# Patient Record
Sex: Male | Born: 2005 | Race: Black or African American | Hispanic: No | Marital: Single | Smoking: Never smoker
Health system: Southern US, Community
[De-identification: ages and names within clinical notes are randomized; demographics above are authoritative.]

## PROBLEM LIST (undated history)

## (undated) DIAGNOSIS — L600 Ingrowing nail: Secondary | ICD-10-CM

## (undated) DIAGNOSIS — J3089 Other allergic rhinitis: Secondary | ICD-10-CM

## (undated) DIAGNOSIS — J45909 Unspecified asthma, uncomplicated: Secondary | ICD-10-CM

---
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
## 2024-02-20 ENCOUNTER — Encounter: Payer: Self-pay | Admitting: Family Medicine

## 2024-02-20 ENCOUNTER — Ambulatory Visit: Admitting: Family Medicine

## 2024-02-20 VITALS — BP 128/62 | HR 70 | Temp 98.7°F | Ht 73.0 in | Wt 296.0 lb

## 2024-02-20 DIAGNOSIS — Z6839 Body mass index (BMI) 39.0-39.9, adult: Secondary | ICD-10-CM

## 2024-02-20 DIAGNOSIS — Z7689 Persons encountering health services in other specified circumstances: Secondary | ICD-10-CM

## 2024-02-20 DIAGNOSIS — J3089 Other allergic rhinitis: Secondary | ICD-10-CM | POA: Diagnosis not present

## 2024-02-20 DIAGNOSIS — J45990 Exercise induced bronchospasm: Secondary | ICD-10-CM | POA: Diagnosis not present

## 2024-02-20 DIAGNOSIS — E6609 Other obesity due to excess calories: Secondary | ICD-10-CM

## 2024-02-20 DIAGNOSIS — R21 Rash and other nonspecific skin eruption: Secondary | ICD-10-CM

## 2024-02-20 DIAGNOSIS — E66812 Obesity, class 2: Secondary | ICD-10-CM | POA: Diagnosis not present

## 2024-02-20 MED ORDER — ALBUTEROL SULFATE HFA 108 (90 BASE) MCG/ACT IN AERS: 2.0000 | INHALATION_SPRAY | Freq: Four times a day (QID) | RESPIRATORY_TRACT | 2 refills | Status: AC | PRN

## 2024-02-20 MED ORDER — MONTELUKAST SODIUM 10 MG PO TABS: 10.0000 mg | ORAL_TABLET | Freq: Every day | ORAL | 2 refills | Status: AC

## 2024-02-20 MED ORDER — TRIAMCINOLONE ACETONIDE 0.025 % EX OINT: 1.0000 | TOPICAL_OINTMENT | Freq: Two times a day (BID) | CUTANEOUS | 0 refills | Status: AC

## 2024-02-20 NOTE — Progress Notes (Signed)
 I,Tianna Badgett,acting as a Neurosurgeon for [employer], NP.,have documented all relevant documentation on the behalf of Bruna Creighton, NP,as directed by  Bruna Creighton, NP while in the presence of Bruna Creighton, NP.  Subjective:  Patient ID: Spencer Dunn , male    DOB: Nov 26, 2005 , 18 y.o.   MRN: [id number]  Chief Complaint  Patient presents with   Establish Care    HPI  Patient is a 18 year old male who presents today to establish care. He has a medical diagnosis if exercised induced asthma, has an inhaler at needed basis, seasonal allergies and a rash. His mother is a patient of Dr Jarold and wants to come back to get his physical with her in few months.     Past Medical History:  Diagnosis Date   Asthma    Environmental and seasonal allergies    Ingrown toenail      Family History  Problem Relation Age of Onset   Diabetes Maternal Grandfather      Current Outpatient Medications:    albuterol (VENTOLIN HFA) 108 (90 Base) MCG/ACT inhaler, Inhale 2 puffs into the lungs every 6 (six) hours as needed for wheezing or shortness of breath., Disp: 8 g, Rfl: 2   cetirizine (ZYRTEC) 10 MG chewable tablet, Chew 10 mg by mouth daily., Disp: , Rfl:    montelukast (SINGULAIR) 10 MG tablet, Take 1 tablet (10 mg total) by mouth daily., Disp: 90 tablet, Rfl: 2   triamcinolone (KENALOG) 0.025 % ointment, Apply 1 Application topically 2 (two) times daily for 5 days., Disp: 10 g, Rfl: 0   Allergies  Allergen Reactions   Banana    Other     Multiple food allergies   Peanut-Containing Drug Products    Tomato      Review of Systems  Constitutional: Negative.   HENT: Negative.    Skin:  Positive for rash.  Psychiatric/Behavioral: Negative.       Today's Vitals   02/20/24 1508  BP: 128/62  Pulse: 70  Temp: 98.7 F (37.1 C)  TempSrc: Oral  SpO2: 97%  Weight: 296 lb (134.3 kg)  Height: 6' 1 (1.854 m)   Body mass index is 39.05 kg/m.  Wt Readings from Last 3 Encounters:  02/20/24 296  lb (134.3 kg) (>99%, Z= 3.02)*  04/08/18 233 lb 7.5 oz (105.9 kg) (>99%, Z= 3.16)*   * Growth percentiles are based on CDC (Boys, 2-20 Years) data.    The ASCVD Risk score (Arnett DK, et al., 2019) failed to calculate for the following reasons:   The 2019 ASCVD risk score is only valid for ages 35 to 89  Objective:  Physical Exam HENT:     Head: Normocephalic.  Cardiovascular:     Rate and Rhythm: Normal rate.  Pulmonary:     Effort: Pulmonary effort is normal.  Skin:    General: Skin is warm and dry.  Neurological:     General: No focal deficit present.     Mental Status: He is alert and oriented to person, place, and time.         Assessment And Plan:  Encounter to establish care with new doctor  Exercise-induced asthma -     Albuterol Sulfate HFA; Inhale 2 puffs into the lungs every 6 (six) hours as needed for wheezing or shortness of breath.  Dispense: 8 g; Refill: 2  Environmental and seasonal allergies -     Montelukast Sodium; Take 1 tablet (10 mg total) by mouth  daily.  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 Advised to aim for at least 150 minutes of exercise per week.    Rash -     Triamcinolone Acetonide; Apply 1 Application topically 2 (two) times daily for 5 days.  Dispense: 10 g; Refill: 0    Return in about 3 months (around 05/22/2024) for physical.  Patient was given opportunity to ask questions. Patient verbalized understanding of the plan and was able to repeat key elements of the plan. All questions were answered to their satisfaction.    I, Bruna Creighton, NP, have reviewed all documentation for this visit. The documentation on 02/20/24 for the exam, diagnosis, procedures, and orders are all accurate and complete.   IF YOU HAVE BEEN REFERRED TO A  SPECIALIST, IT MAY TAKE 1-2 WEEKS TO SCHEDULE/PROCESS THE REFERRAL. IF YOU HAVE NOT HEARD FROM US /SPECIALIST IN TWO WEEKS, PLEASE GIVE US  A CALL AT [phone].

## 2024-02-20 NOTE — Assessment & Plan Note (Signed)
 He is encouraged to strive for BMI less than 30 to decrease cardiac risk. Advised to aim for at least 150 minutes of exercise per week.

## 2024-04-07 ENCOUNTER — Other Ambulatory Visit: Payer: Self-pay | Admitting: Internal Medicine

## 2024-04-07 DIAGNOSIS — R21 Rash and other nonspecific skin eruption: Secondary | ICD-10-CM

## 2024-05-22 ENCOUNTER — Encounter: Payer: Self-pay | Admitting: Family Medicine

## 2024-05-22 ENCOUNTER — Ambulatory Visit: Admitting: Family Medicine

## 2024-05-22 VITALS — BP 124/80 | HR 70 | Temp 98.2°F | Ht 70.0 in | Wt 291.0 lb

## 2024-05-22 DIAGNOSIS — Z113 Encounter for screening for infections with a predominantly sexual mode of transmission: Secondary | ICD-10-CM

## 2024-05-22 DIAGNOSIS — E66813 Obesity, class 3: Secondary | ICD-10-CM | POA: Diagnosis not present

## 2024-05-22 DIAGNOSIS — Z136 Encounter for screening for cardiovascular disorders: Secondary | ICD-10-CM

## 2024-05-22 DIAGNOSIS — Z6841 Body Mass Index (BMI) 40.0 and over, adult: Secondary | ICD-10-CM

## 2024-05-22 DIAGNOSIS — Z0000 Encounter for general adult medical examination without abnormal findings: Secondary | ICD-10-CM | POA: Diagnosis not present

## 2024-05-22 DIAGNOSIS — Z1159 Encounter for screening for other viral diseases: Secondary | ICD-10-CM

## 2024-05-22 DIAGNOSIS — Z23 Encounter for immunization: Secondary | ICD-10-CM | POA: Diagnosis not present

## 2024-05-22 DIAGNOSIS — Z114 Encounter for screening for human immunodeficiency virus [HIV]: Secondary | ICD-10-CM

## 2024-05-22 NOTE — Progress Notes (Signed)
 I,Jameka J Llittleton, CMA,acting as a Neurosurgeon for [employer], NP.,have documented all relevant documentation on the behalf of Bruna Creighton, NP,as directed by  Bruna Creighton, NP while in the presence of Bruna Creighton, NP.  Subjective:   Patient ID: Spencer Dunn , male    DOB: June 02, 2006 , 18 y.o.   MRN: [id number]  Chief Complaint  Patient presents with   Annual Exam    Patient presents today for a physical. Patient doesn't have any questions or concerns at this time.    HPI Discussed the use of AI scribe software for clinical note transcription with the patient, who gave verbal consent to proceed.  History of Present Illness   Spencer Dunn is a 18 year old male who presents for an annual wellness visit.  He has not yet received his flu shot for the season. He exercises regularly, engaging in cardio and home body workouts such as push-ups and sit-ups, approximately four days a week. He used to go to the gym but now prefers outdoor cardio activities.  He denies alcohol and tobacco use. He is sexually active and uses condoms for protection. He drives.  His family history is significant for diabetes, with his grandfather and aunt having had the condition. He is unsure about his tetanus vaccination status and does not recall having one since he was 18 years old.  He is a Medical laboratory scientific officer at [employer], Glass blower/designer in [employer], sports, and lives in [place] with his family. 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  He has a history of exercise-induced asthma but has not experienced symptoms recently. He has an inhaler prescribed for this condition and uses montelukast  and cetirizine, particularly during allergy season. He feels well overall.  He denies pain and reports regular bowel movements.    Spencer Dunn is a 18 year old male who presents for an annual wellness visit.  He has not yet received his flu shot for the season. He exercises regularly,  engaging in cardio and home body workouts such as push-ups and sit-ups, approximately four days a week. He used to go to the gym but now prefers outdoor cardio activities.  He denies alcohol and tobacco use. He is sexually active and uses condoms for protection. He drives.  His family history is significant for diabetes, with his grandfather and aunt having had the condition. He is unsure about his tetanus vaccination status and does not recall having one since he was 18 years old.  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  He has a history of exercise-induced asthma but has not experienced symptoms recently. He has an inhaler prescribed for this condition and uses montelukast  and cetirizine, particularly during allergy season. He feels well overall.  He denies pain and reports regular bowel movements.  BMI is 41. He is encouraged to strive for BMI less than 30 to decrease cardiac risk. Advised to aim for at least 150 minutes of exercise per week.         Past Medical History:  Diagnosis Date   Asthma    Environmental and seasonal allergies    Ingrown toenail      Family History  Problem Relation Age of Onset   Diabetes Maternal Grandfather      Current Outpatient Medications:  albuterol  (VENTOLIN  HFA) 108 (90 Base) MCG/ACT inhaler, Inhale 2 puffs into the lungs every 6 (six) hours as needed for wheezing or shortness of breath., Disp: 8 g, Rfl: 2   cetirizine (ZYRTEC) 10 MG chewable tablet, Chew 10 mg by mouth daily., Disp: , Rfl:    montelukast  (SINGULAIR ) 10 MG tablet, Take 1 tablet (10 mg total) by mouth daily., Disp: 90 tablet, Rfl: 2   Allergies  Allergen Reactions   Banana    Other     Multiple food allergies   Peanut-Containing Drug Products    Tomato       Flowsheet Row Office Visit from 05/22/2024 in [facility]  PHQ-2 Total Score 0   Social History   Substance and Sexual Activity  Alcohol Use Never   Social History   Tobacco Use  Smoking Status Never  Smokeless Tobacco Never  .   Review of Systems  Constitutional: Negative.   HENT: Negative.    Eyes: Negative.   Respiratory: Negative.    Cardiovascular: Negative.   Gastrointestinal: Negative.   Endocrine: Negative.   Genitourinary: Negative.   Musculoskeletal: Negative.   Skin: Negative.   Neurological: Negative.   Hematological: Negative.   Psychiatric/Behavioral: Negative.       Today's Vitals   05/22/24 1408  BP: 124/80  Pulse: 70  Temp: 98.2 F (36.8 C)  TempSrc: Oral  Weight: 291 lb (132 kg)  Height: 5' 10 (1.778 m)  PainSc: 0-No pain   Body mass index is 41.75 kg/m.  Wt Readings from Last 3 Encounters:  05/22/24 291 lb (132 kg) (>99%, Z= 2.97)*  02/20/24 296 lb (134.3 kg) (>99%, Z= 3.02)*  04/08/18 233 lb 7.5 oz (105.9 kg) (>99%, Z= 3.16)*   * Growth percentiles are based on CDC (Boys, 2-20 Years) data.    Objective:  Physical Exam Constitutional:      Appearance: Normal appearance.  HENT:     Head: Normocephalic.     Nose: Nose normal.  Cardiovascular:     Rate and Rhythm: Normal rate and regular rhythm.     Pulses: Normal pulses.     Heart sounds: Normal heart sounds.  Pulmonary:     Effort: Pulmonary effort is normal.     Breath sounds: Normal breath sounds.  Abdominal:     General: Bowel sounds are normal.  Musculoskeletal:        General: Normal range of motion.  Skin:    General: Skin is warm and dry.  Neurological:     General: No focal deficit present.     Mental Status: He is alert and oriented to person, place, and time. Mental status is at baseline.  Psychiatric:        Mood and Affect: Mood normal.         Assessment And Plan:    Encounter for general adult medical examination w/o abnormal findings -     CBC -      CMP14+EGFR  Screening for HIV (human immunodeficiency virus) -     HIV Antibody (routine testing w rflx)  Encounter for hepatitis C screening test for low risk patient -     Hepatitis C antibody  Screening for STD (sexually transmitted disease) -     Chlamydia/Gonococcus/Trichomonas, NAA  Need for influenza vaccination -     Flu vaccine trivalent PF, 6mos and older(Flulaval,Afluria,Fluarix,Fluzone)  Encounter for screening for cardiovascular disorders -     Lipid panel  Need for hepatitis B  screening test -     Hepatitis B surface antibody,qualitative  Class 3 severe obesity due to excess calories without serious comorbidity with body mass index (BMI) of 40.0 to 44.9 in adult [facility]) Assessment & Plan: He is encouraged to strive for BMI less than 30 to decrease cardiac risk. Advised to aim for at least 150 minutes of exercise per week.       Return for 1 year physical. Patient was given opportunity to ask questions. Patient verbalized understanding of the plan and was able to repeat key elements of the plan. All questions were answered to their satisfaction.   I, Bruna Creighton, NP, have reviewed all documentation for this visit. The documentation on 06/04/2024 for the exam, diagnosis, procedures, and orders are all accurate and complete.

## 2024-05-22 NOTE — Patient Instructions (Signed)
 Health Maintenance, Male  Adopting a healthy lifestyle and getting preventive care are important in promoting health and wellness. Ask your health care provider about:  The right schedule for you to have regular tests and exams.  Things you can do on your own to prevent diseases and keep yourself healthy.  What should I know about diet, weight, and exercise?  Eat a healthy diet    Eat a diet that includes plenty of vegetables, fruits, low-fat dairy products, and lean protein.  Do not eat a lot of foods that are high in solid fats, added sugars, or sodium.  Maintain a healthy weight  Body mass index (BMI) is a measurement that can be used to identify possible weight problems. It estimates body fat based on height and weight. Your health care provider can help determine your BMI and help you achieve or maintain a healthy weight.  Get regular exercise  Get regular exercise. This is one of the most important things you can do for your health. Most adults should:  Exercise for at least 150 minutes each week. The exercise should increase your heart rate and make you sweat (moderate-intensity exercise).  Do strengthening exercises at least twice a week. This is in addition to the moderate-intensity exercise.  Spend less time sitting. Even light physical activity can be beneficial.  Watch cholesterol and blood lipids  Have your blood tested for lipids and cholesterol at 19 years of age, then have this test every 5 years.  You may need to have your cholesterol levels checked more often if:  Your lipid or cholesterol levels are high.  You are older than 19 years of age.  You are at high risk for heart disease.  What should I know about cancer screening?  Many types of cancers can be detected early and may often be prevented. Depending on your health history and family history, you may need to have cancer screening at various ages. This may include screening for:  Colorectal cancer.  Prostate cancer.  Skin cancer.  Lung  cancer.  What should I know about heart disease, diabetes, and high blood pressure?  Blood pressure and heart disease  High blood pressure causes heart disease and increases the risk of stroke. This is more likely to develop in people who have high blood pressure readings or are overweight.  Talk with your health care provider about your target blood pressure readings.  Have your blood pressure checked:  Every 3-5 years if you are 24-52 years of age.  Every year if you are 3 years old or older.  If you are between the ages of 60 and 72 and are a current or former smoker, ask your health care provider if you should have a one-time screening for abdominal aortic aneurysm (AAA).  Diabetes  Have regular diabetes screenings. This checks your fasting blood sugar level. Have the screening done:  Once every three years after age 66 if you are at a normal weight and have a low risk for diabetes.  More often and at a younger age if you are overweight or have a high risk for diabetes.  What should I know about preventing infection?  Hepatitis B  If you have a higher risk for hepatitis B, you should be screened for this virus. Talk with your health care provider to find out if you are at risk for hepatitis B infection.  Hepatitis C  Blood testing is recommended for:  Everyone born from [birth year] through 1966.  Anyone  with known risk factors for hepatitis C.  Sexually transmitted infections (STIs)  You should be screened each year for STIs, including gonorrhea and chlamydia, if:  You are sexually active and are younger than 19 years of age.  You are older than 19 years of age and your health care provider tells you that you are at risk for this type of infection.  Your sexual activity has changed since you were last screened, and you are at increased risk for chlamydia or gonorrhea. Ask your health care provider if you are at risk.  Ask your health care provider about whether you are at high risk for HIV. Your health care provider  may recommend a prescription medicine to help prevent HIV infection. If you choose to take medicine to prevent HIV, you should first get tested for HIV. You should then be tested every 3 months for as long as you are taking the medicine.  Follow these instructions at home:  Alcohol use  Do not drink alcohol if your health care provider tells you not to drink.  If you drink alcohol:  Limit how much you have to 0-2 drinks a day.  Know how much alcohol is in your drink. In the [place] one drink equals one 12 oz bottle of beer (355 mL), one 5 oz glass of wine (148 mL), or one 1 oz glass of hard liquor (44 mL).  Lifestyle  Do not use any products that contain nicotine or tobacco. These products include cigarettes, chewing tobacco, and vaping devices, such as e-cigarettes. If you need help quitting, ask your health care provider.  Do not use street drugs.  Do not share needles.  Ask your health care provider for help if you need support or information about quitting drugs.  General instructions  Schedule regular health, dental, and eye exams.  Stay current with your vaccines.  Tell your health care provider if:  You often feel depressed.  You have ever been abused or do not feel safe at home.  Summary  Adopting a healthy lifestyle and getting preventive care are important in promoting health and wellness.  Follow your health care provider's instructions about healthy diet, exercising, and getting tested or screened for diseases.  Follow your health care provider's instructions on monitoring your cholesterol and blood pressure.  This information is not intended to replace advice given to you by your health care provider. Make sure you discuss any questions you have with your health care provider.  Document Revised: 12/27/2020 Document Reviewed: 12/27/2020  Elsevier Patient Education  2024 [employer].

## 2024-05-23 LAB — CMP14+EGFR
ALT: 23 IU/L (ref 0–44)
AST: 19 IU/L (ref 0–40)
Albumin: 4.8 g/dL (ref 4.3–5.2)
Alkaline Phosphatase: 66 IU/L (ref 51–125)
BUN/Creatinine Ratio: 13 (ref 9–20)
BUN: 15 mg/dL (ref 6–20)
Bilirubin Total: 0.3 mg/dL (ref 0.0–1.2)
CO2: 24 mmol/L (ref 20–29)
Calcium: 10.1 mg/dL (ref 8.7–10.2)
Chloride: 101 mmol/L (ref 96–106)
Creatinine, Ser: 1.16 mg/dL (ref 0.76–1.27)
Globulin, Total: 2.5 g/dL (ref 1.5–4.5)
Glucose: 87 mg/dL (ref 70–99)
Potassium: 4.5 mmol/L (ref 3.5–5.2)
Sodium: 140 mmol/L (ref 134–144)
Total Protein: 7.3 g/dL (ref 6.0–8.5)
eGFR: 93 mL/min/1.73 (ref >59)

## 2024-05-23 LAB — LIPID PANEL
Chol/HDL Ratio: 5.6 ratio — ABNORMAL HIGH (ref 0.0–5.0)
Cholesterol, Total: 223 mg/dL — ABNORMAL HIGH (ref 100–169)
HDL: 40 mg/dL (ref >39)
LDL Chol Calc (NIH): 159 mg/dL — ABNORMAL HIGH (ref 0–109)
Triglycerides: 133 mg/dL — ABNORMAL HIGH (ref 0–89)
VLDL Cholesterol Cal: 24 mg/dL (ref 5–40)

## 2024-05-23 LAB — CBC
Hematocrit: 48.3 % (ref 37.5–51.0)
Hemoglobin: 15.9 g/dL (ref 13.0–17.7)
MCH: 28.4 pg (ref 26.6–33.0)
MCHC: 32.9 g/dL (ref 31.5–35.7)
MCV: 86 fL (ref 79–97)
Platelets: 339 x10E3/uL (ref 150–450)
RBC: 5.59 x10E6/uL (ref 4.14–5.80)
RDW: 13.1 % (ref 11.6–15.4)
WBC: 6.8 x10E3/uL (ref 3.4–10.8)

## 2024-05-23 LAB — HIV ANTIBODY (ROUTINE TESTING W REFLEX): HIV Screen 4th Generation wRfx: NONREACTIVE

## 2024-05-23 LAB — HEPATITIS C ANTIBODY: Hep C Virus Ab: NONREACTIVE

## 2024-05-23 LAB — HEPATITIS B SURFACE ANTIBODY,QUALITATIVE: Hep B Surface Ab, Qual: NONREACTIVE

## 2024-05-26 LAB — CHLAMYDIA/GONOCOCCUS/TRICHOMONAS, NAA
Chlamydia by NAA: NEGATIVE
Gonococcus by NAA: NEGATIVE
Trich vag by NAA: NEGATIVE

## 2024-06-05 ENCOUNTER — Ambulatory Visit: Payer: Self-pay | Admitting: Family Medicine

## 2024-06-05 DIAGNOSIS — Z1159 Encounter for screening for other viral diseases: Secondary | ICD-10-CM | POA: Insufficient documentation

## 2024-06-05 NOTE — Assessment & Plan Note (Signed)
 He is encouraged to strive for BMI less than 30 to decrease cardiac risk. Advised to aim for at least 150 minutes of exercise per week.

## 2024-06-05 NOTE — Progress Notes (Signed)
 All your labs including HIV and chlamydia tests are normal/negative except for your cholesterol levels that are elevated.  Low fat diet and exercise are advised.Eat heart healthy foods such as baked ,broiled, grilled fish, instead of fried, greasy foods, nuts such as walnuts, almonds, macadamia etc Avocados and so on.  Thank you!

## 2024-11-13 ENCOUNTER — Ambulatory Visit: Admitting: Dermatology
